# Patient Record
Sex: Male | Born: 1990 | Race: White | Hispanic: No | Marital: Single | Smoking: Never smoker
Health system: Southern US, Community
[De-identification: ages and names within clinical notes are randomized; demographics above are authoritative.]

---

## 2013-06-25 ENCOUNTER — Emergency Department: Payer: Self-pay | Admitting: Internal Medicine

## 2013-06-25 LAB — URINALYSIS, COMPLETE
Bilirubin,UR: NEGATIVE
Blood: NEGATIVE
Ph: 5 (ref 4.5–8.0)
Protein: NEGATIVE
Specific Gravity: 1.032 (ref 1.003–1.030)

## 2014-07-09 ENCOUNTER — Ambulatory Visit: Admit: 2014-07-09 | Disposition: A | Discharge status: Home / Self Care | Payer: Self-pay | Admitting: Family Medicine

## 2014-09-04 ENCOUNTER — Emergency Department: Payer: Self-pay | Admitting: Emergency Medicine

## 2014-10-08 ENCOUNTER — Ambulatory Visit: Payer: Self-pay | Admitting: Family Medicine

## 2014-10-14 ENCOUNTER — Ambulatory Visit: Payer: Self-pay | Admitting: Family Medicine

## 2014-10-26 ENCOUNTER — Ambulatory Visit: Payer: Self-pay | Admitting: Family Medicine

## 2015-06-01 DIAGNOSIS — L309 Dermatitis, unspecified: Secondary | ICD-10-CM | POA: Diagnosis not present

## 2015-12-22 ENCOUNTER — Ambulatory Visit
Admission: RE | Admit: 2015-12-22 | Discharge: 2015-12-22 | Disposition: A | Discharge status: Home / Self Care | Payer: Commercial Managed Care - PPO | Source: Ambulatory Visit | Attending: Family Medicine | Admitting: Family Medicine

## 2015-12-22 ENCOUNTER — Other Ambulatory Visit: Payer: Self-pay | Admitting: Family Medicine

## 2015-12-22 DIAGNOSIS — M25512 Pain in left shoulder: Secondary | ICD-10-CM | POA: Insufficient documentation

## 2015-12-23 ENCOUNTER — Ambulatory Visit: Payer: Commercial Managed Care - PPO | Admitting: Family Medicine

## 2015-12-24 ENCOUNTER — Ambulatory Visit (INDEPENDENT_AMBULATORY_CARE_PROVIDER_SITE_OTHER): Payer: Commercial Managed Care - PPO | Admitting: Family Medicine

## 2015-12-24 DIAGNOSIS — M25512 Pain in left shoulder: Secondary | ICD-10-CM

## 2015-12-24 NOTE — Progress Notes (Signed)
Patient ID: Brandon Gross A Gonder, male   DOB: 05-05-1991, 25 y.o.   MRN: [id number]  Patient presents today with symptoms of left shoulder pain. Patient states that [facility] days ago he was at first base when the runner ran into his left shoulder as he was trying to catch the ball. This arm was abducted at the time. He denies any previous injury to the left shoulder in the past. He states that his shoulder just feels very stiff and he has decreased range of motion. He has been taking Ibuprofen for his symptoms. X-rays were done which did not show any acute fracture.  ROS: Negative except mentioned above.  Vitals as per Epic. GENERAL: NAD RESP: CTA B CARD: RRR  MSK: L Shoulder- tenderness mostly posterior, decreased range of motion above 90, patient with discomfort with external rotation and abduction, positive apprehension test, negative drop arm, negative lift off, positive impingement test, NV intact NEURO: CN II-XII grossly intact   A/P: L Shoulder Injury- x-rays were reviewed no fracture noted, would recommend MRI to evaluate for labral/RTC injury, Naprosyn when necessary, sling for comfort, ice prn, will have patient follow up with Dr. Ardine Engiehl next week after MRI is done.

## 2015-12-28 ENCOUNTER — Ambulatory Visit (HOSPITAL_COMMUNITY)
Admission: RE | Admit: 2015-12-28 | Discharge: 2015-12-28 | Disposition: A | Discharge status: Home / Self Care | Payer: PRIVATE HEALTH INSURANCE | Source: Ambulatory Visit | Attending: Family Medicine | Admitting: Family Medicine

## 2015-12-28 DIAGNOSIS — M7592 Shoulder lesion, unspecified, left shoulder: Secondary | ICD-10-CM | POA: Diagnosis not present

## 2015-12-28 DIAGNOSIS — M25512 Pain in left shoulder: Secondary | ICD-10-CM | POA: Diagnosis not present

## 2015-12-30 ENCOUNTER — Ambulatory Visit: Payer: PRIVATE HEALTH INSURANCE

## 2016-10-09 ENCOUNTER — Encounter: Payer: Self-pay | Admitting: Emergency Medicine

## 2016-10-09 ENCOUNTER — Emergency Department
Admission: EM | Admit: 2016-10-09 | Discharge: 2016-10-09 | Disposition: A | Discharge status: Home / Self Care | Payer: Commercial Managed Care - PPO | Attending: Emergency Medicine | Admitting: Emergency Medicine

## 2016-10-09 DIAGNOSIS — R21 Rash and other nonspecific skin eruption: Secondary | ICD-10-CM | POA: Insufficient documentation

## 2016-10-09 DIAGNOSIS — B958 Unspecified staphylococcus as the cause of diseases classified elsewhere: Secondary | ICD-10-CM | POA: Diagnosis not present

## 2016-10-09 MED ORDER — SULFAMETHOXAZOLE-TRIMETHOPRIM 800-160 MG PO TABS: 1.0000 | ORAL_TABLET | Freq: Two times a day (BID) | ORAL | 0 refills | Status: AC

## 2016-10-09 NOTE — ED Notes (Signed)
See triage note  Developed a rash to right shin since yesterday  No resp distress noted

## 2016-10-09 NOTE — ED Provider Notes (Signed)
[facility] Emergency Department Provider Note  ____________________________________________  Time seen: Approximately 7:35 PM  I have reviewed the triage vital signs and the nursing notes.   HISTORY  Chief Complaint Rash   HPI Brandon Gross is a 26 y.o. male presenting to the emergency department with an erythematous, macular rash with vesicles filled with purulent fluid of the skin overlying the anterior right shank that started yesterday. Patient states that he sustained a skin abrasion from sliding on the ground while playing baseball last week. Patient denies fever, chills or fatigue. 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 Patient denies chest pain, chest tightness, shortness of breath, abdominal pain, nausea and vomiting. No alleviating measures have been attempted.     History reviewed. No pertinent past medical history.  There are no active problems to display for this patient.   No past surgical history on file.  Prior to Admission medications   Medication Sig Start Date End Date Taking? Authorizing Provider  sulfamethoxazole-trimethoprim (BACTRIM DS) 800-160 MG tablet Take 1 tablet by mouth 2 (two) times daily. 10/09/16 10/19/16  Orvil Feil, PA-C    Allergies Amoxicillin  No family history on file.  Social History Social History  Substance Use Topics  . Smoking status: Never Smoker  . Smokeless tobacco: Never Used  . Alcohol use No     Review of Systems  Constitutional: No fever/chills ENT: No upper respiratory complaints. Cardiovascular: no chest pain. Respiratory: no cough. No SOB. Musculoskeletal: Negative for musculoskeletal pain. Skin: Patient has rash. Neurological: Negative for headaches, focal weakness or numbness. ____________________________________________   PHYSICAL EXAM:  VITAL SIGNS: ED Triage Vitals  Enc Vitals Group     BP 10/09/16 1810 (!) 123/54     Pulse Rate 10/09/16 1810 65      Resp 10/09/16 1810 17     Temp 10/09/16 1810 98.2 F (36.8 C)     Temp Source 10/09/16 1810 Oral     SpO2 10/09/16 1810 100 %     Weight 10/09/16 1806 220 lb (99.8 kg)     Height 10/09/16 1806 6\' 2"  (1.88 m)     Head Circumference --      Peak Flow --      Pain Score 10/09/16 1806 0     Pain Loc --      Pain Edu? --      Excl. in GC? --      Constitutional: Alert and oriented. Well appearing and in no acute distress. Cardiovascular: Normal rate, regular rhythm. Normal S1 and S2.  Good peripheral circulation. Respiratory: Normal respiratory effort without tachypnea or retractions. Lungs CTAB. Good air entry to the bases with no decreased or absent breath sounds. Musculoskeletal: Full range of motion to all extremities. No gross deformities appreciated. Neurologic:  Normal speech and language. No gross focal neurologic deficits are appreciated.  Skin: Patient has a diffuse, round macular rash of the skin overlying the anterior right shank. Rash has small vesicles filled with purulent exudate. No streaking or surrounding cellulitis visualized. Psychiatric: Mood and affect are normal. Speech and behavior are normal. Patient exhibits appropriate insight and judgement. ___________________________________________   LABS (all labs ordered are listed, but only abnormal results are displayed)  Labs Reviewed - No data to display ____________________________________________  EKG   ____________________________________________  RADIOLOGY   No results found.  ____________________________________________    PROCEDURES  Procedure(s) performed:    Procedures    Medications - No data to display  ____________________________________________   INITIAL IMPRESSION / ASSESSMENT AND PLAN / ED COURSE  Pertinent labs & imaging results that were available during my care of the patient were reviewed by me and considered in my medical decision making (see chart for  details).  Review of the [place] CSRS was performed in accordance of the [facility] prior to dispensing any controlled drugs.     Assessment and Plan: Staph Infection:  Patient presents to the emergency department with a macular rash with vesicles filled with purulent exudate. Physical exam findings are consistent with staphylococcal infection. Patient was discharged with bactrim. He was advised to follow-up with his primary care provider in one week. All patient questions were answered. ____________________________________________  FINAL CLINICAL IMPRESSION(S) / ED DIAGNOSES  Final diagnoses:  Staph infection      NEW MEDICATIONS STARTED DURING THIS VISIT:  New Prescriptions   SULFAMETHOXAZOLE-TRIMETHOPRIM (BACTRIM DS) 800-160 MG TABLET    Take 1 tablet by mouth 2 (two) times daily.        This chart was dictated using voice recognition software/Dragon. Despite best efforts to proofread, errors can occur which can change the meaning. Any change was purely unintentional.    Orvil FeilJaclyn M Landynn Dupler, PA-C 10/09/16 1951    Orvil FeilJaclyn M Auburn Hester, PA-C 10/09/16 1957    Minna AntisKevin Paduchowski, MD 10/09/16 2240

## 2016-10-09 NOTE — ED Triage Notes (Addendum)
Pt reports rash to right shin since yesterday; reports the rash is around his "turf burn cut" from sliding while playing baseball.

## 2016-11-26 IMAGING — CR RIGHT FOREARM - 2 VIEW
1 series · 2 of 2 positions shown · non-contrast
Comparison: None.

CLINICAL DATA: Patient fell onto a glass and cut arm with
approximately 2 inch laceration on the proximal/ posterior right
forearm. No active bleeding.

EXAM:
RIGHT FOREARM - 2 VIEW

[Series 1: x forearm ap right · 0.14mm/px · 2 of 2 slices shown]
[im 1/2]
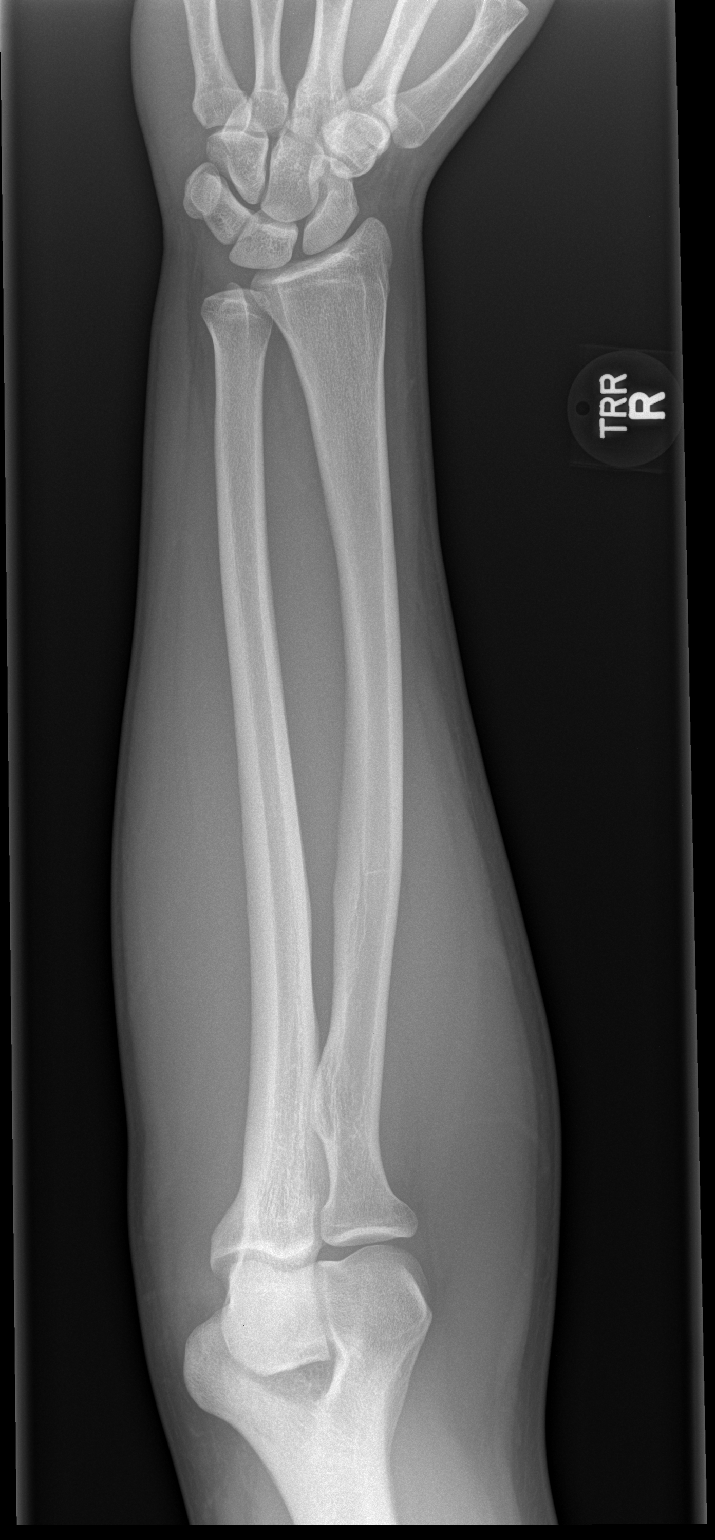
[im 2/2]
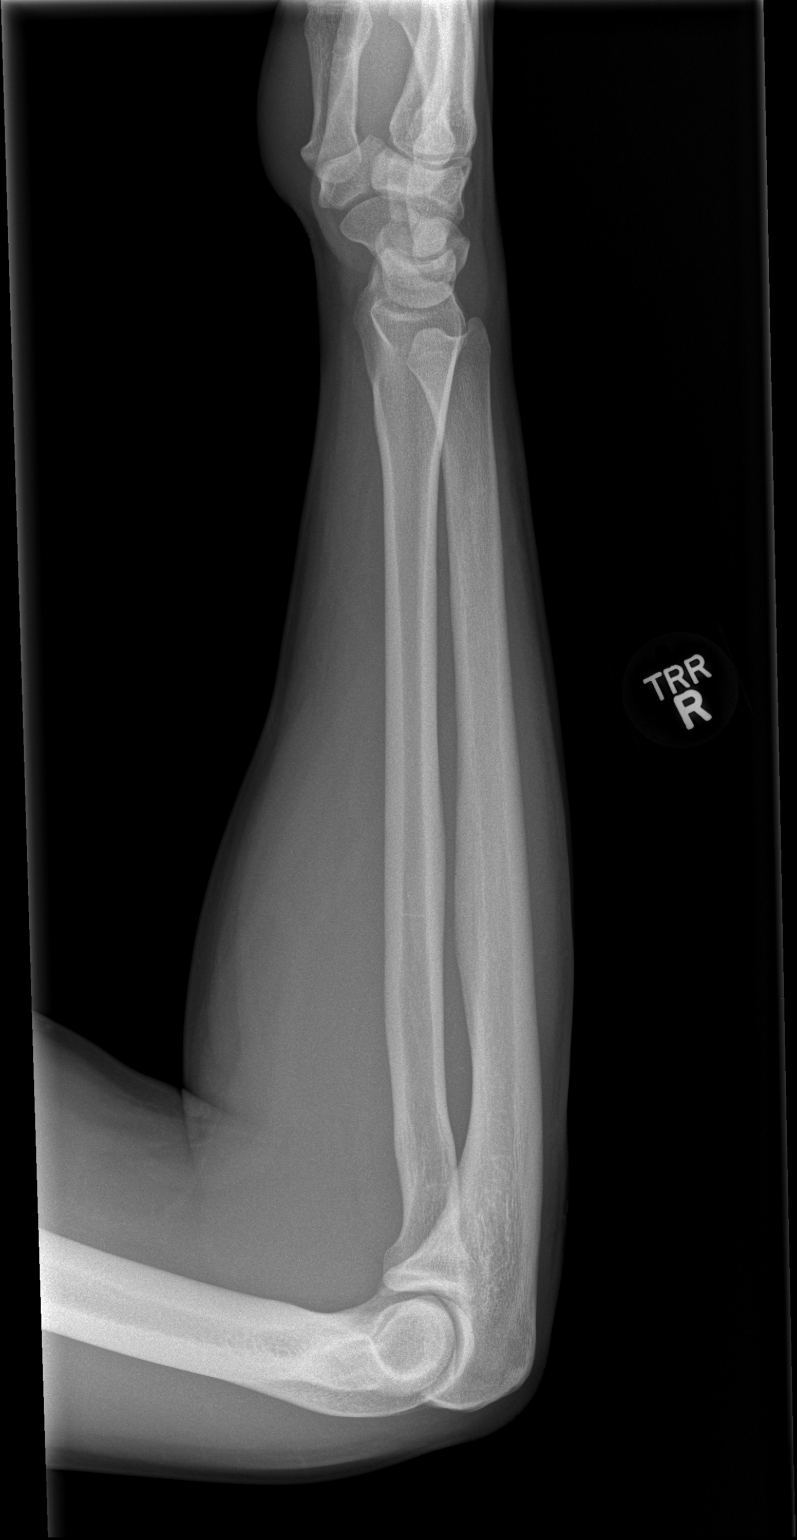

[2 of 2 positions shown; findings below may reference images not displayed]

FINDINGS: Right forearm appears intact. No evidence of acute fracture or
subluxation. No focal bone lesion or bone destruction. Bone cortex
and trabecular architecture appear intact. No radiopaque soft tissue
foreign bodies.
IMPRESSION: No acute bony abnormalities. No radiopaque soft tissue foreign
bodies demonstrated.

## 2018-03-21 IMAGING — MR MR SHOULDER*L* W/O CM
4 of 5 series · 19 of 40 positions shown · non-contrast
Comparison: None.

CLINICAL DATA: Left shoulder pain with painful range of motion

EXAM:
MRI OF THE LEFT SHOULDER WITHOUT CONTRAST
TECHNIQUE: Multiplanar, multisequence MR imaging of the shoulder was performed.
No intravenous contrast was administered.

[Series 2: T2 fat-sat · axial · 4.0mm · 0.27mm/px · z∈[-36,+44]mm · 5 of 20 slices shown (1 of 3)]
[im 1/20]
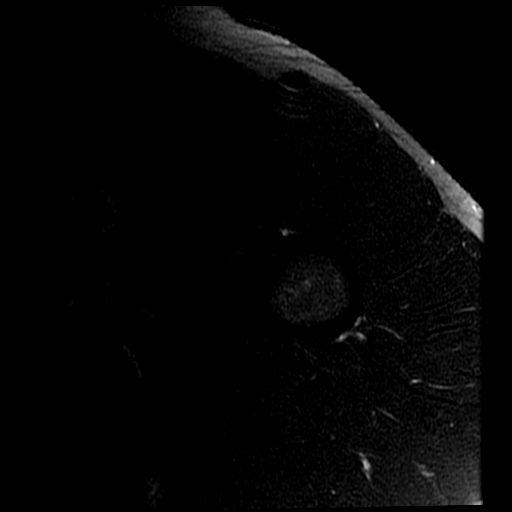
[im 3/20]
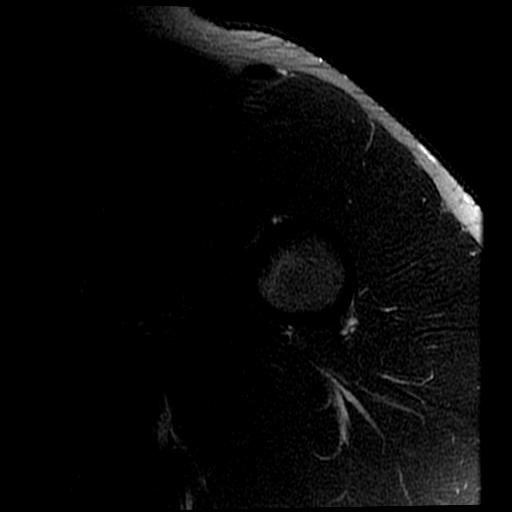
[im 6/20]
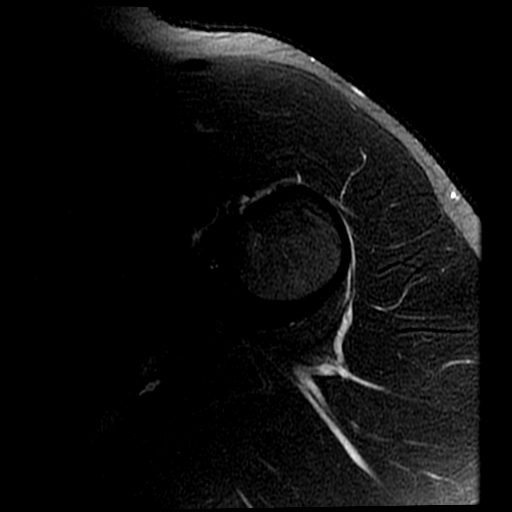
[im 11/20]
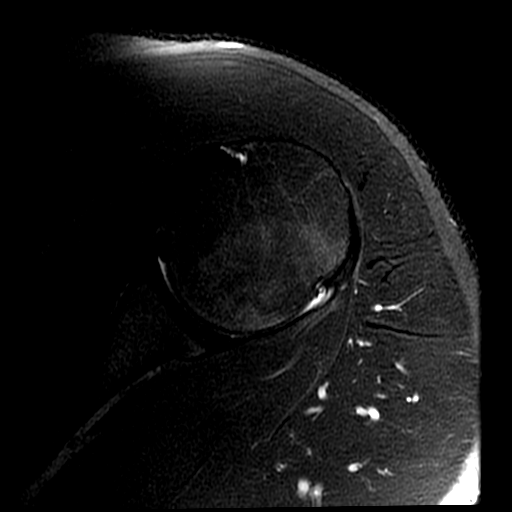
[im 17/20]
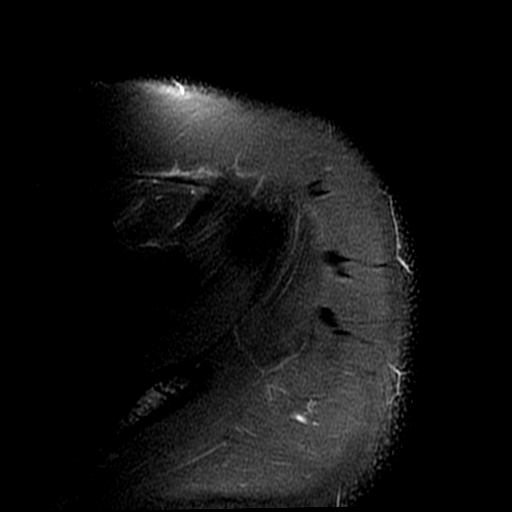

[Series 3: T2 fat-sat · oblique · 4.0mm · 0.29mm/px · 3 of 18 slices shown (2 of 3)]
[im 3/18]
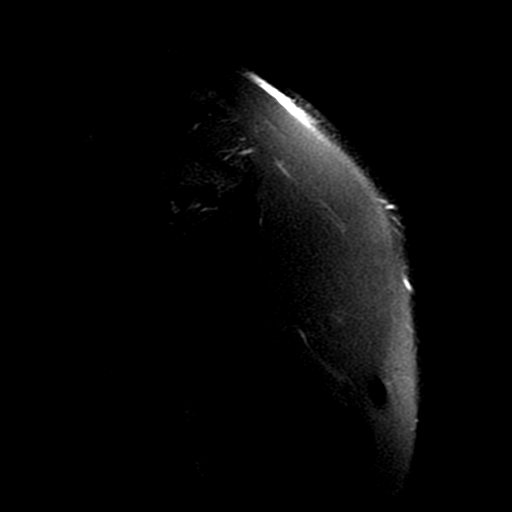
[im 10/18]
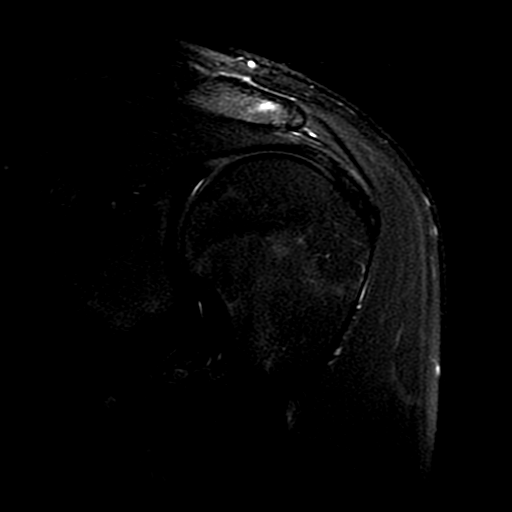
[im 15/18]
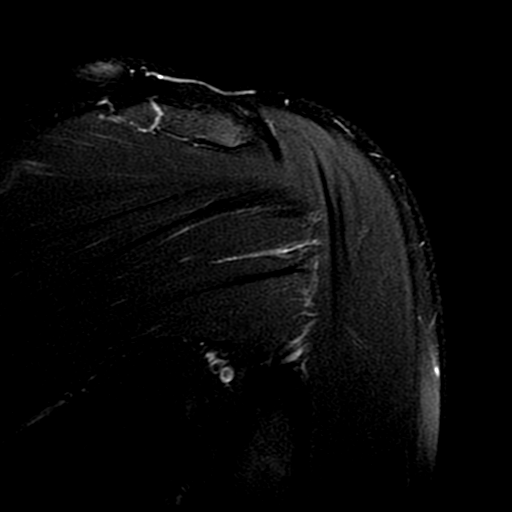

[Series 4: PD · oblique · 4.0mm · 0.29mm/px · 8 of 18 slices shown]
[im 1/18]
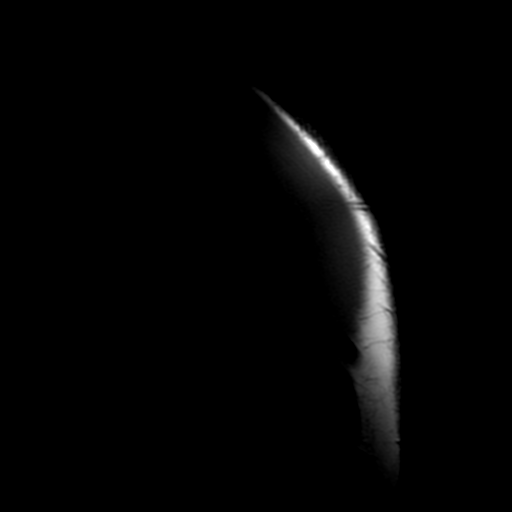
[im 3/18]
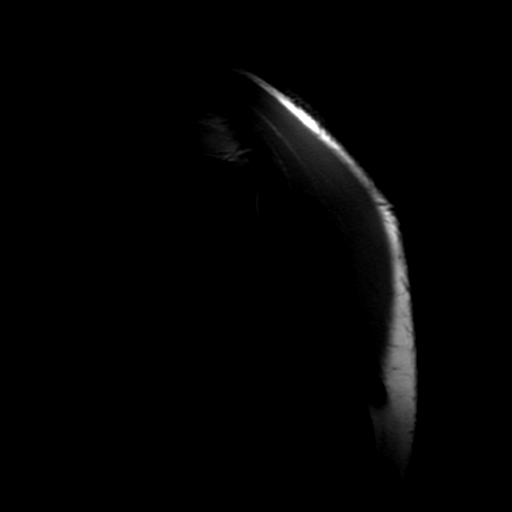
[im 5/18]
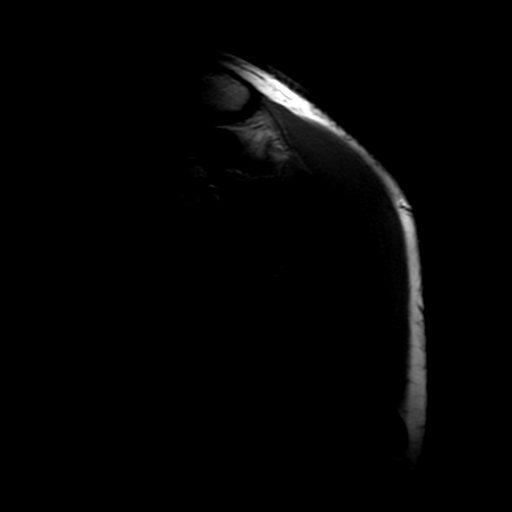
[im 8/18]
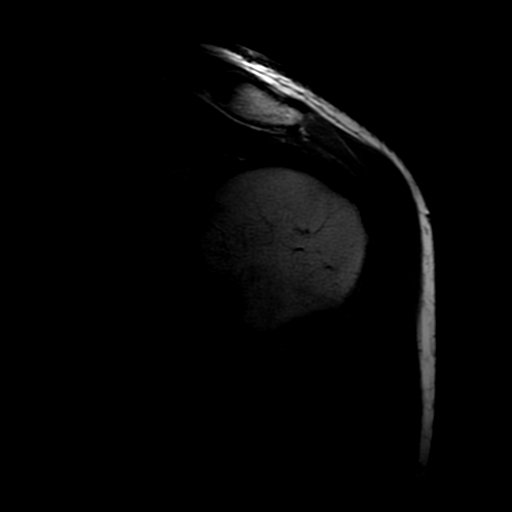
[im 10/18]
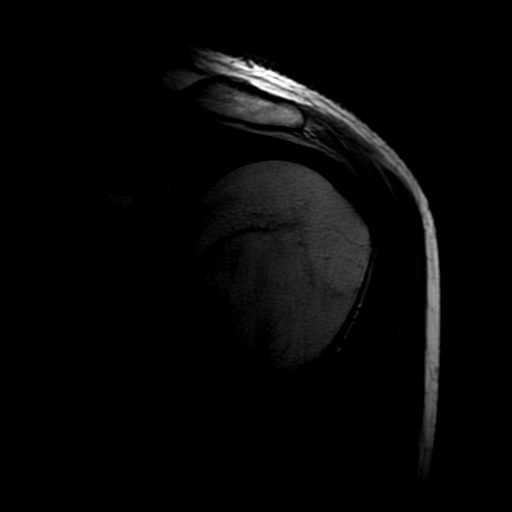
[im 13/18]
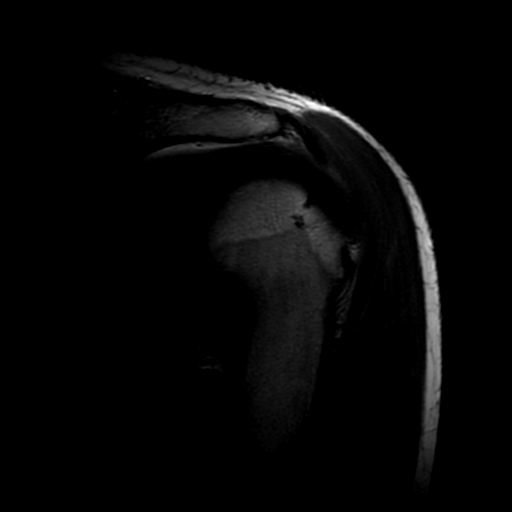
[im 15/18]
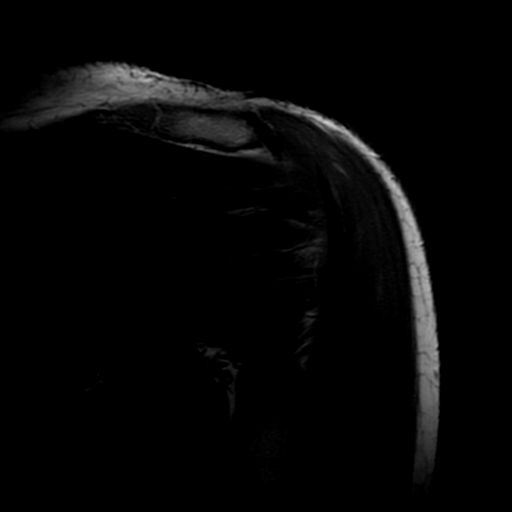
[im 18/18]
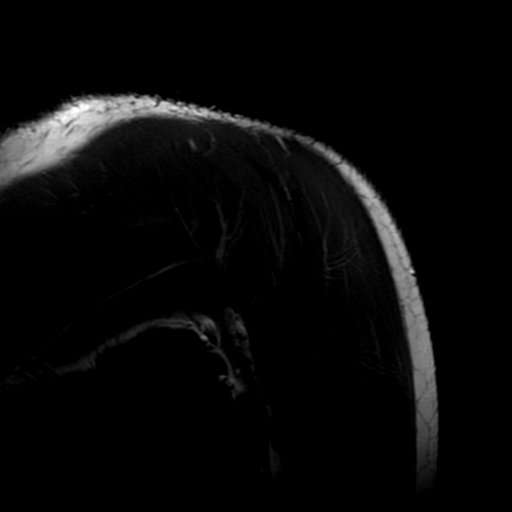

[Series 6: T2 fat-sat · oblique · 4.0mm · 0.29mm/px · 3 of 18 slices shown (3 of 3)]
[im 3/18]
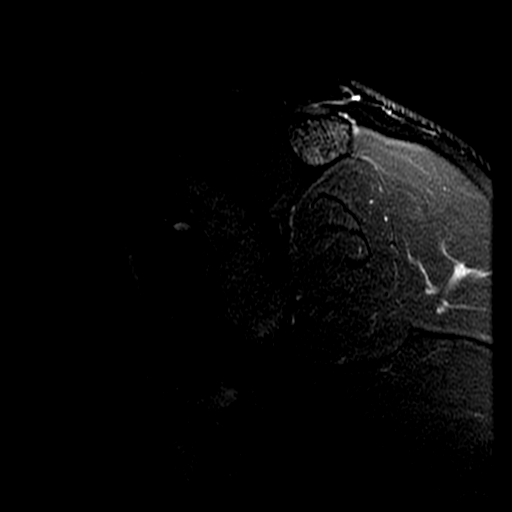
[im 10/18]
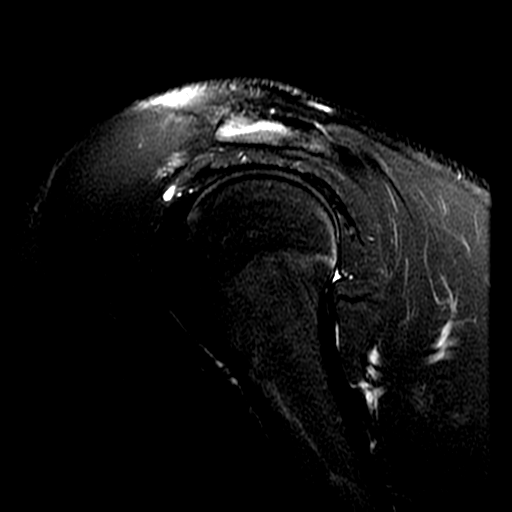
[im 15/18]
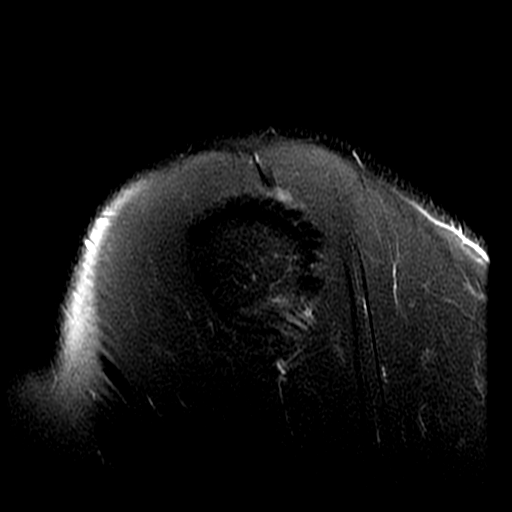

[19 of 40 positions shown; findings below may reference images not displayed]

FINDINGS: Rotator cuff: Supraspinatus tendon is intact. Mild tendinosis of the
infraspinatus tendon. Teres minor tendon is intact. Subscapularis
tendon is intact.

Muscles: No atrophy or fatty replacement of nor abnormal signal
within, the muscles of the rotator cuff.

Biceps long head:  Intact.

Acromioclavicular Joint: Normal acromioclavicular joint. No
subacromial/subdeltoid bursal fluid. Type I acromion.

Glenohumeral Joint: No joint effusion.  No chondral defect.

Labrum: Grossly intact, but evaluation is limited by lack of
intraarticular fluid.

Bones:  No marrow signal abnormality.  No fracture or dislocation.
IMPRESSION: 1. Mild tendinosis of the infraspinatus tendon without a discrete
tear.
